# Patient Record
Sex: Male | Born: 1965
Health system: Southern US, Community
[De-identification: ages and names within clinical notes are randomized; demographics above are authoritative.]

---

## 2001-12-14 ENCOUNTER — Encounter: Payer: Self-pay | Admitting: Internal Medicine

## 2001-12-14 ENCOUNTER — Inpatient Hospital Stay (HOSPITAL_COMMUNITY): Admission: EM | Admit: 2001-12-14 | Discharge: 2001-12-17 | Payer: Self-pay | Admitting: Emergency Medicine

## 2001-12-14 ENCOUNTER — Encounter: Payer: Self-pay | Admitting: Emergency Medicine

## 2005-11-18 ENCOUNTER — Encounter: Admission: RE | Admit: 2005-11-18 | Discharge: 2005-11-18 | Payer: Self-pay | Admitting: Emergency Medicine

## 2016-03-09 DIAGNOSIS — M109 Gout, unspecified: Secondary | ICD-10-CM | POA: Diagnosis not present

## 2016-03-09 DIAGNOSIS — E039 Hypothyroidism, unspecified: Secondary | ICD-10-CM | POA: Diagnosis not present

## 2016-03-09 DIAGNOSIS — Z23 Encounter for immunization: Secondary | ICD-10-CM | POA: Diagnosis not present

## 2016-03-09 DIAGNOSIS — I10 Essential (primary) hypertension: Secondary | ICD-10-CM | POA: Diagnosis not present

## 2016-06-22 DIAGNOSIS — Z0000 Encounter for general adult medical examination without abnormal findings: Secondary | ICD-10-CM | POA: Diagnosis not present

## 2016-06-22 DIAGNOSIS — I10 Essential (primary) hypertension: Secondary | ICD-10-CM | POA: Diagnosis not present

## 2016-06-22 DIAGNOSIS — E039 Hypothyroidism, unspecified: Secondary | ICD-10-CM | POA: Diagnosis not present

## 2016-06-22 DIAGNOSIS — Z13220 Encounter for screening for lipoid disorders: Secondary | ICD-10-CM | POA: Diagnosis not present

## 2016-06-22 DIAGNOSIS — M109 Gout, unspecified: Secondary | ICD-10-CM | POA: Diagnosis not present

## 2016-10-21 DIAGNOSIS — E039 Hypothyroidism, unspecified: Secondary | ICD-10-CM | POA: Diagnosis not present

## 2016-12-22 DIAGNOSIS — M109 Gout, unspecified: Secondary | ICD-10-CM | POA: Diagnosis not present

## 2016-12-22 DIAGNOSIS — E039 Hypothyroidism, unspecified: Secondary | ICD-10-CM | POA: Diagnosis not present

## 2016-12-22 DIAGNOSIS — Z23 Encounter for immunization: Secondary | ICD-10-CM | POA: Diagnosis not present

## 2016-12-22 DIAGNOSIS — D72829 Elevated white blood cell count, unspecified: Secondary | ICD-10-CM | POA: Diagnosis not present

## 2016-12-22 DIAGNOSIS — I10 Essential (primary) hypertension: Secondary | ICD-10-CM | POA: Diagnosis not present

## 2017-02-15 DIAGNOSIS — L723 Sebaceous cyst: Secondary | ICD-10-CM | POA: Diagnosis not present

## 2017-03-16 DIAGNOSIS — L723 Sebaceous cyst: Secondary | ICD-10-CM | POA: Diagnosis not present

## 2017-06-27 DIAGNOSIS — M109 Gout, unspecified: Secondary | ICD-10-CM | POA: Diagnosis not present

## 2017-06-27 DIAGNOSIS — Z0000 Encounter for general adult medical examination without abnormal findings: Secondary | ICD-10-CM | POA: Diagnosis not present

## 2017-06-27 DIAGNOSIS — Z13220 Encounter for screening for lipoid disorders: Secondary | ICD-10-CM | POA: Diagnosis not present

## 2017-06-27 DIAGNOSIS — I10 Essential (primary) hypertension: Secondary | ICD-10-CM | POA: Diagnosis not present

## 2017-06-27 DIAGNOSIS — Z131 Encounter for screening for diabetes mellitus: Secondary | ICD-10-CM | POA: Diagnosis not present

## 2017-06-27 DIAGNOSIS — E039 Hypothyroidism, unspecified: Secondary | ICD-10-CM | POA: Diagnosis not present

## 2018-01-04 DIAGNOSIS — I10 Essential (primary) hypertension: Secondary | ICD-10-CM | POA: Diagnosis not present

## 2018-01-04 DIAGNOSIS — E039 Hypothyroidism, unspecified: Secondary | ICD-10-CM | POA: Diagnosis not present

## 2018-01-04 DIAGNOSIS — M109 Gout, unspecified: Secondary | ICD-10-CM | POA: Diagnosis not present

## 2018-01-04 DIAGNOSIS — Z23 Encounter for immunization: Secondary | ICD-10-CM | POA: Diagnosis not present

## 2020-09-02 ENCOUNTER — Other Ambulatory Visit: Payer: Self-pay | Admitting: Family Medicine

## 2020-09-02 DIAGNOSIS — R7401 Elevation of levels of liver transaminase levels: Secondary | ICD-10-CM

## 2020-09-25 ENCOUNTER — Ambulatory Visit
Admission: RE | Admit: 2020-09-25 | Discharge: 2020-09-25 | Disposition: A | Discharge status: Home / Self Care | Payer: Commercial Managed Care - PPO | Source: Ambulatory Visit | Attending: Family Medicine | Admitting: Family Medicine

## 2020-09-25 DIAGNOSIS — R7401 Elevation of levels of liver transaminase levels: Secondary | ICD-10-CM

## 2020-09-30 ENCOUNTER — Other Ambulatory Visit: Payer: Self-pay | Admitting: *Deleted

## 2020-09-30 DIAGNOSIS — Z87891 Personal history of nicotine dependence: Secondary | ICD-10-CM

## 2020-11-06 ENCOUNTER — Encounter: Payer: Commercial Managed Care - PPO | Admitting: Primary Care

## 2020-11-06 ENCOUNTER — Ambulatory Visit: Payer: Commercial Managed Care - PPO

## 2020-11-20 ENCOUNTER — Encounter: Payer: Commercial Managed Care - PPO | Admitting: Primary Care

## 2020-11-20 ENCOUNTER — Telehealth: Payer: Self-pay | Admitting: Primary Care

## 2020-11-20 ENCOUNTER — Other Ambulatory Visit: Payer: Self-pay

## 2020-11-20 NOTE — Progress Notes (Deleted)
Shared Decision Making Visit Lung Cancer Screening Program [phone])   Eligibility: Age 55 y.o. Pack Years Smoking History Calculation *** (# packs/per year x # years smoked) Recent History of coughing up blood  {YES NO:22349} Unexplained weight loss? {YES NO:22349} ( >Than 15 pounds within the last 6 months ) Prior History Lung / other cancer {YES NO:22349} (Diagnosis within the last 5 years already requiring surveillance chest CT Scans). Smoking Status {Smoking Status:21012044} Former Smokers: Years since quit: {Smoking numbers:21012046}  Quit Date: ***  Visit Components: Discussion included one or more decision making aids. {YES NO:22349} Discussion included risk/benefits of screening. {YES NO:22349} Discussion included potential follow up diagnostic testing for abnormal scans. {YES NO:22349} Discussion included meaning and risk of over diagnosis. {YES NO:22349} Discussion included meaning and risk of False Positives. {YES NO:22349} Discussion included meaning of total radiation exposure. {YES [id number]  Counseling Included: Importance of adherence to annual lung cancer LDCT screening. {YES NO:22349} Impact of comorbidities on ability to participate in the program. {YES NO:22349} Ability and willingness to under diagnostic treatment. {YES NO:22349}  Smoking Cessation Counseling: Current Smokers:  Discussed importance of smoking cessation. {YES [id number] Information about tobacco cessation classes and interventions provided to patient. {YES [id number] Patient provided with "ticket" for LDCT Scan. {YES NO:22349} Symptomatic Patient. {YES NO:22349}  Counseling{Symptomatic Patient:21012041} Diagnosis Code: Tobacco Use Z72.0 Asymptomatic Patient {YES NO:22349}  Counseling {Asymptomatic patient:21012042} Former Smokers:  Discussed the importance of maintaining cigarette abstinence. {YES NO:22349} Diagnosis Code: Personal History of Nicotine Dependence. R97.588 Information about  tobacco cessation classes and interventions provided to patient. {Responses; yes/no/refused:32142} Patient provided with "ticket" for LDCT Scan. {YES [id number] Written Order for Lung Cancer Screening with LDCT placed in Epic. {Smoking cessesion custom:21012043} (CT Chest Lung Cancer Screening Low Dose W/O CM) [id number] Z12.2-Screening of respiratory organs Z87.891-Personal history of nicotine dependence   Glenford Bayley, NP

## 2020-11-20 NOTE — Telephone Encounter (Signed)
Called patient twice with no answer. I left message that patient needs to reschedule shared decision making visit before his LDCT scan scheduled for 9/23 at 4:30pm

## 2020-11-23 NOTE — Telephone Encounter (Signed)
Spoke with pt and rescheduled shared decision visit for 12/16/20 11:30. CT will be rescheduled. PT verbalized understanding. Nothing further needed.

## 2020-11-27 ENCOUNTER — Ambulatory Visit: Payer: Commercial Managed Care - PPO

## 2020-12-16 ENCOUNTER — Encounter: Payer: Commercial Managed Care - PPO | Admitting: Acute Care

## 2020-12-18 ENCOUNTER — Ambulatory Visit: Payer: Commercial Managed Care - PPO

## 2021-04-16 ENCOUNTER — Encounter: Payer: Commercial Managed Care - PPO | Admitting: Primary Care

## 2021-04-16 ENCOUNTER — Ambulatory Visit
Admission: RE | Admit: 2021-04-16 | Discharge: 2021-04-16 | Disposition: A | Discharge status: Home / Self Care | Payer: Commercial Managed Care - PPO | Source: Ambulatory Visit | Attending: Acute Care | Admitting: Acute Care

## 2021-04-16 ENCOUNTER — Ambulatory Visit: Payer: Commercial Managed Care - PPO

## 2021-04-16 ENCOUNTER — Encounter: Payer: Self-pay | Admitting: Acute Care

## 2021-04-16 ENCOUNTER — Other Ambulatory Visit: Payer: Self-pay

## 2021-04-16 ENCOUNTER — Ambulatory Visit (INDEPENDENT_AMBULATORY_CARE_PROVIDER_SITE_OTHER): Payer: Commercial Managed Care - PPO | Admitting: Acute Care

## 2021-04-16 DIAGNOSIS — Z87891 Personal history of nicotine dependence: Secondary | ICD-10-CM | POA: Diagnosis not present

## 2021-04-16 NOTE — Patient Instructions (Signed)
Thank you for participating in the [place] [facility] Screening Program. °It was our pleasure to meet you today. °We will call you with the results of your scan within the next few days. °Your scan will be assigned a Lung RADS category score by the physicians reading the scans.  °This Lung RADS score determines follow up scanning.  °See below for description of categories, and follow up screening recommendations. °We will be in touch to schedule your follow up screening annually or based on recommendations of our providers. °We will fax a copy of your scan results to your Primary Care Physician, or the physician who referred you to the program, to ensure they have the results. °Please call the office if you have any questions or concerns regarding your scanning experience or results.  °Our office number is [phone]. °Please speak with Denise Phelps, RN. She is our Lung Cancer Screening RN. °If she is unavailable when you call, please have the office staff send her a message. She will return your call at her earliest convenience. °Remember, if your scan is normal, we will scan you annually as long as you continue to meet the criteria for the program. (Age 55-77, Current smoker or smoker who has quit within the last 15 years). °If you are a smoker, remember, quitting is the single most powerful action that you can take to decrease your risk of lung cancer and other pulmonary, breathing related problems. °We know quitting is hard, and we are here to help.  °Please let us know if there is anything we can do to help you meet your goal of quitting. °If you are a former smoker, congratulations. We are proud of you! Remain smoke free! °Remember you can refer friends or family members through the number above.  °We will screen them to make sure they meet criteria for the program. °Thank you for helping us take better care of you by participating in Lung Screening. ° °You can receive free nicotine replacement therapy  ( patches, gum or mints) by calling 1-800-QUIT NOW. Please call so we can get you on the path to becoming  a non-smoker. I know it is hard, but you can do this! ° °Lung RADS Categories: ° °Lung RADS 1: no nodules or definitely non-concerning nodules.  °Recommendation is for a repeat annual scan in 12 months. ° °Lung RADS 2:  nodules that are non-concerning in appearance and behavior with a very low likelihood of becoming an active cancer. °Recommendation is for a repeat annual scan in 12 months. ° °Lung RADS 3: nodules that are probably non-concerning , includes nodules with a low likelihood of becoming an active cancer.  Recommendation is for a 6-month repeat screening scan. Often noted after an upper respiratory illness. We will be in touch to make sure you have no questions, and to schedule your 6-month scan. ° °Lung RADS 4 A: nodules with concerning findings, recommendation is most often for a follow up scan in 3 months or additional testing based on our provider's assessment of the scan. We will be in touch to make sure you have no questions and to schedule the recommended 3 month follow up scan. ° °Lung RADS 4 B:  indicates findings that are concerning. We will be in touch with you to schedule additional diagnostic testing based on our provider's  assessment of the scan. ° °Hypnosis for smoking cessation  °[employer] °[phone] ° °Acupuncture for smoking cessation  °[facility] °[phone]  °

## 2021-04-16 NOTE — Progress Notes (Signed)
Virtual Visit via Telephone Note  I connected with Randy Watkins on 04/16/21 at  9:30 AM EST by telephone and verified that I am speaking with the correct person using two identifiers.  Location: Patient:  At home Provider:  [address], [place], [place], [address]    I discussed the limitations, risks, security and privacy concerns of performing an evaluation and management service by telephone and the availability of in person appointments. I also discussed with the patient that there may be a patient responsible charge related to this service. The patient expressed understanding and agreed to proceed.   Shared Decision Making Visit Lung Cancer Screening Program [phone])   Eligibility: Age 56 y.o. Pack Years Smoking History Calculation 37 pack year smoking history (# packs/per year x # years smoked) Recent History of coughing up blood  no Unexplained weight loss? no ( >Than 15 pounds within the last 6 months ) Prior History Lung / other cancer no (Diagnosis within the last 5 years already requiring surveillance chest CT Scans). Smoking Status Former Smoker Former Smokers: Years since quit: < 1 year  Quit Date: 01/30/2021  Visit Components: Discussion included one or more decision making aids. yes Discussion included risk/benefits of screening. yes Discussion included potential follow up diagnostic testing for abnormal scans. yes Discussion included meaning and risk of over diagnosis. yes Discussion included meaning and risk of False Positives. yes Discussion included meaning of total radiation exposure. yes  Counseling Included: Importance of adherence to annual lung cancer LDCT screening. yes Impact of comorbidities on ability to participate in the program. yes Ability and willingness to under diagnostic treatment. yes  Smoking Cessation Counseling: Current Smokers:  Discussed importance of smoking cessation. yes Information about tobacco cessation classes and  interventions provided to patient. yes Patient provided with "ticket" for LDCT Scan. yes Symptomatic Patient. no  Counseling NA Diagnosis Code: Tobacco Use Z72.0 Asymptomatic Patient yes  Counseling (Intermediate counseling: > three minutes counseling) [id number] Former Smokers:  Discussed the importance of maintaining cigarette abstinence. yes Diagnosis Code: Personal History of Nicotine Dependence. Y48.250 Information about tobacco cessation classes and interventions provided to patient. Yes Patient provided with "ticket" for LDCT Scan. yes Written Order for Lung Cancer Screening with LDCT placed in Epic. Yes (CT Chest Lung Cancer Screening Low Dose W/O CM) [id number] Z12.2-Screening of respiratory organs Z87.891-Personal history of nicotine dependence  I spent 25 minutes of face to face time/virtual visit time  with  Randy Watkins discussing the risks and benefits of lung cancer screening. We took the time to pause the power point at intervals to allow for questions to be asked and answered to ensure understanding. We discussed that he had taken the single most powerful action possible to decrease his risk of developing lung cancer when he quit smoking. I counseled him to remain smoke free, and to contact me if he ever had the desire to smoke again so that I can provide resources and tools to help support the effort to remain smoke free. We discussed the time and location of the scan, and that either  Abigail Miyamoto RN, Karlton Lemon, RN or I  or I will call / send a letter with the results within  24-72 hours of receiving them. He has the office contact information in the event he needs to speak with me,  he verbalized understanding of all of the above and had no further questions upon leaving the office.     I explained to the patient that there  has been a high incidence of coronary artery disease noted on these exams. I explained that this is a non-gated exam therefore degree or severity cannot be  determined. This patient is on statin therapy. I have asked the patient to follow-up with their PCP regarding any incidental finding of coronary artery disease and management with diet or medication as they feel is clinically indicated. The patient verbalized understanding of the above and had no further questions.  Quit 01/2021 and has not had a cigarette since     Bevelyn Ngo, NP 04/16/2021

## 2021-04-26 ENCOUNTER — Telehealth: Payer: Self-pay | Admitting: Acute Care

## 2021-04-26 DIAGNOSIS — Z87891 Personal history of nicotine dependence: Secondary | ICD-10-CM

## 2021-04-26 DIAGNOSIS — R911 Solitary pulmonary nodule: Secondary | ICD-10-CM

## 2021-04-26 NOTE — Addendum Note (Signed)
Addended by: Karlton Lemon on: 04/26/2021 04:13 PM   Modules accepted: Orders

## 2021-04-26 NOTE — Telephone Encounter (Signed)
Order placed for follow up scan 6 months.  Results and plan faxed to PCP

## 2021-04-26 NOTE — Telephone Encounter (Signed)
I have called the patient with the results of his low dose CT Chest. His scan was read as a Lung  RADS 3, nodules that are probably benign findings, short term follow up suggested: includes nodules with a low likelihood of becoming a clinically active cancer. Radiology recommends a 6 month repeat LDCT follow up. Pt. Is in agreement with this plan. 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 Calcifications of the aortic valve.Pt. is on statin therapy.  Angelique Blonder, please fax results to PCP and order 6 month follow up low dose CT Chest. Thanks so much

## 2021-11-12 ENCOUNTER — Ambulatory Visit
Admission: RE | Admit: 2021-11-12 | Discharge: 2021-11-12 | Disposition: A | Discharge status: Home / Self Care | Payer: Commercial Managed Care - PPO | Source: Ambulatory Visit | Attending: Acute Care | Admitting: Acute Care

## 2021-11-12 DIAGNOSIS — R911 Solitary pulmonary nodule: Secondary | ICD-10-CM

## 2021-11-12 DIAGNOSIS — Z87891 Personal history of nicotine dependence: Secondary | ICD-10-CM

## 2021-11-16 ENCOUNTER — Other Ambulatory Visit: Payer: Self-pay

## 2021-11-16 ENCOUNTER — Telehealth: Payer: Self-pay

## 2021-11-16 DIAGNOSIS — Z87891 Personal history of nicotine dependence: Secondary | ICD-10-CM

## 2021-11-16 DIAGNOSIS — Z122 Encounter for screening for malignant neoplasm of respiratory organs: Secondary | ICD-10-CM

## 2021-11-16 NOTE — Telephone Encounter (Signed)
Spoke with patient by phone using two patient identifiers.  Results of LDCT follow up scan reviewed with patient.  No changes to lung nodule needing follow up so no concerns for cancer at this time. Will place back on annual LDCT schedule.  Hepatic nodule also unchanged and not concerning at this time.  Patient acknowledged understanding and had no further questions.  Results to PCP and new order placed for LDCT 2024

## 2022-02-08 IMAGING — US US ABDOMEN LIMITED
1 series · 14 of 25 positions shown · non-contrast
Comparison: None.

CLINICAL DATA: Transaminitis

EXAM:
ULTRASOUND ABDOMEN LIMITED RIGHT UPPER QUADRANT

[Series 1: us abdomen limited · 0.20mm/px · 14 of 79 slices shown]
[im 1/79]
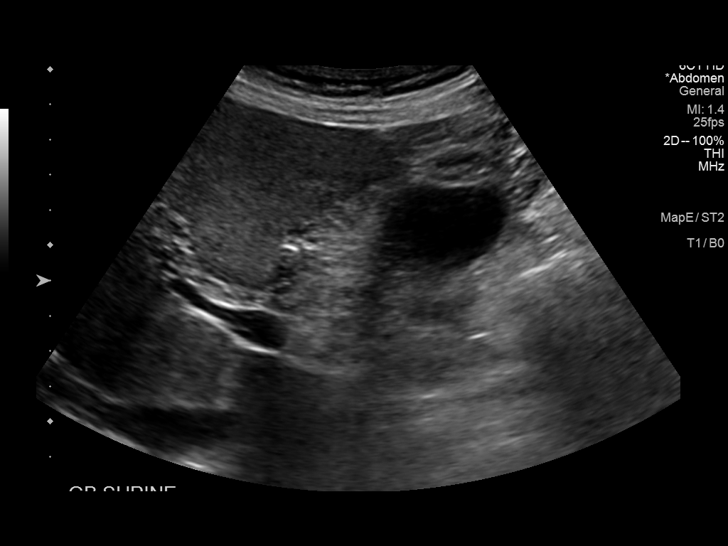
[im 7/79]
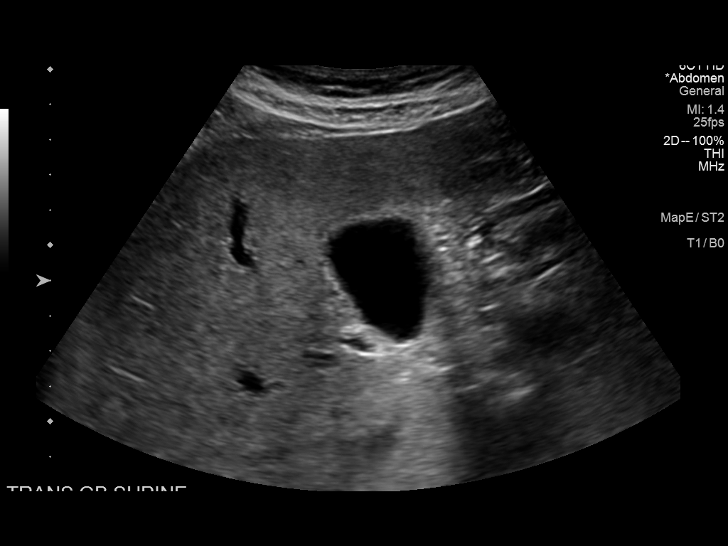
[im 14/79]
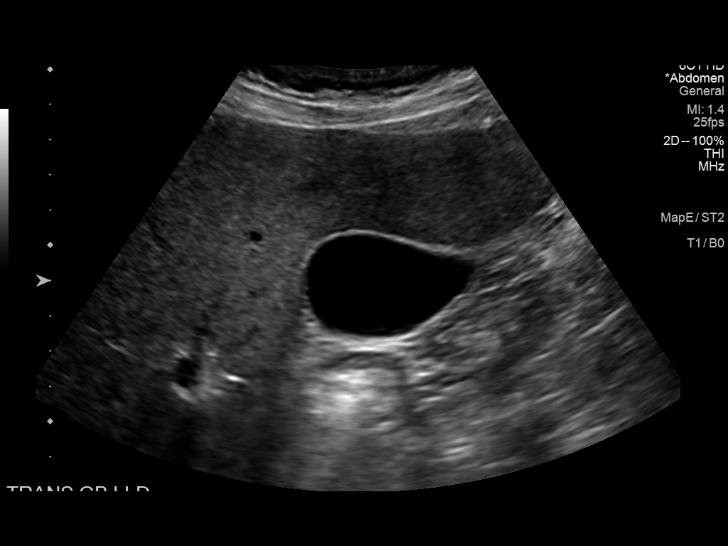
[im 20/79]
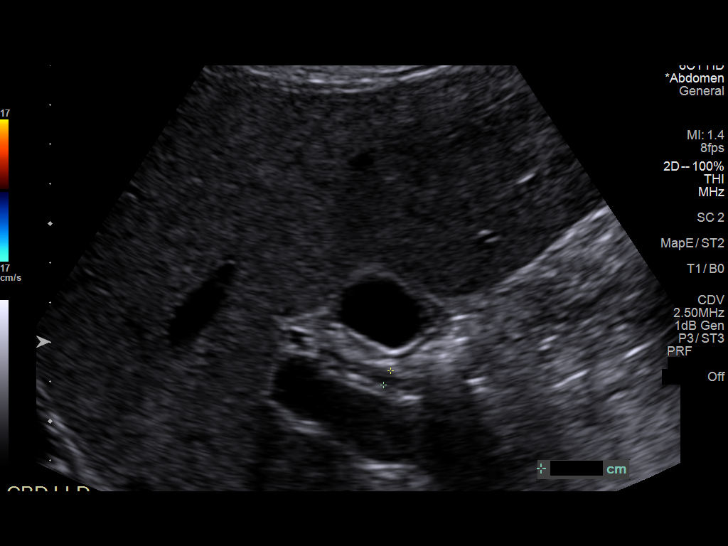
[im 27/79]
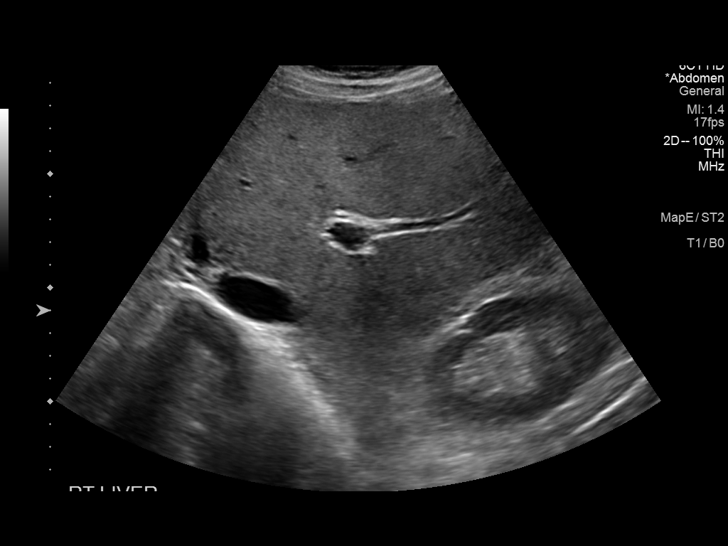
[im 30/79]
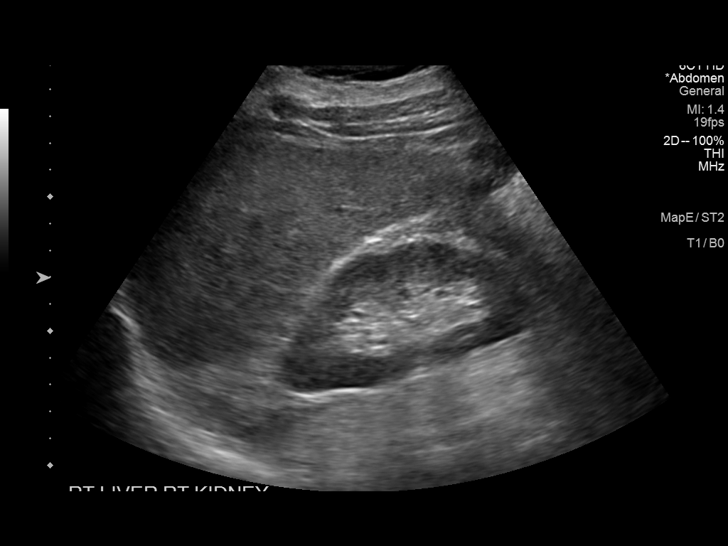
[im 36/79]
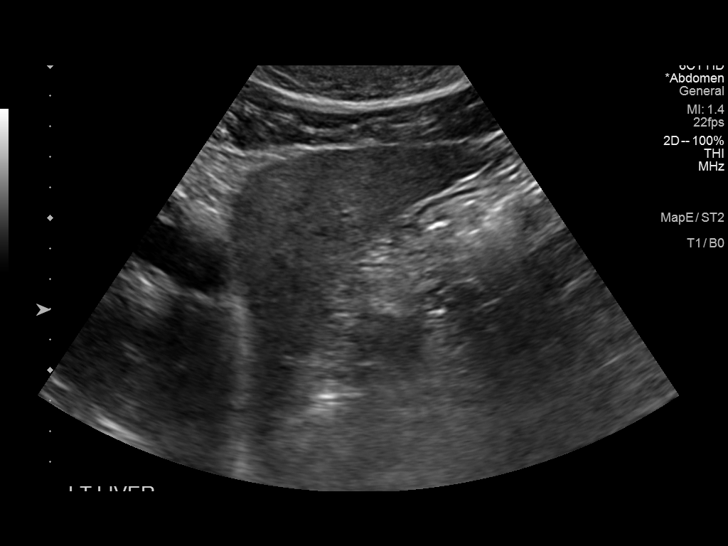
[im 43/79]
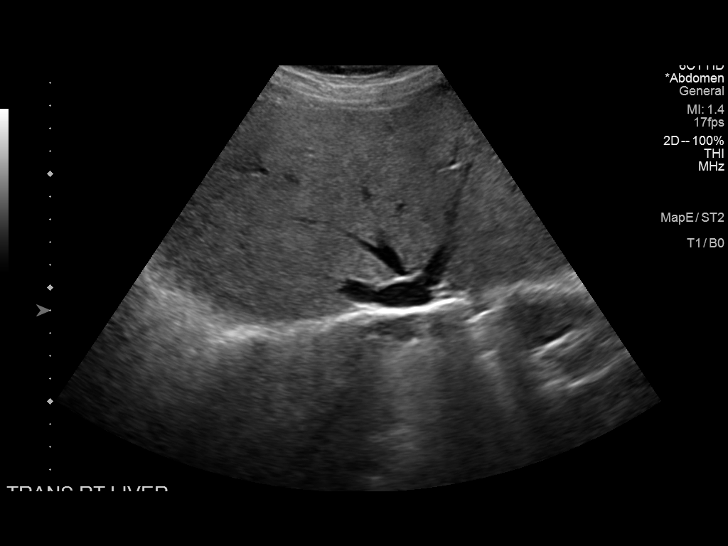
[im 49/79]
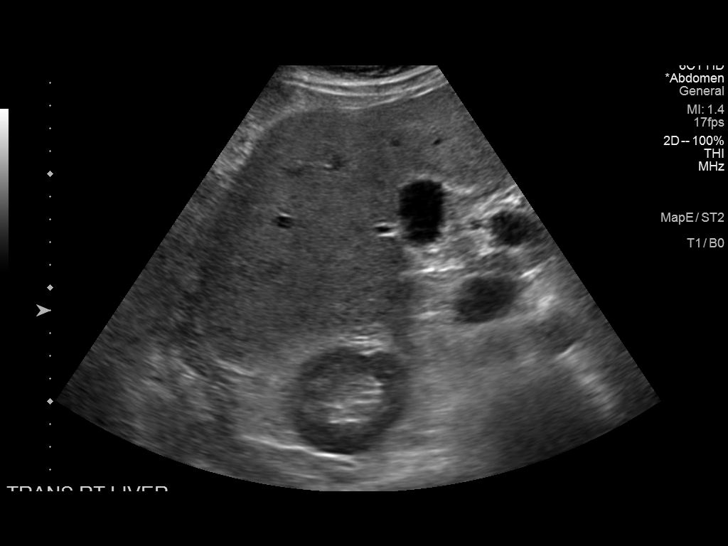
[im 53/79]
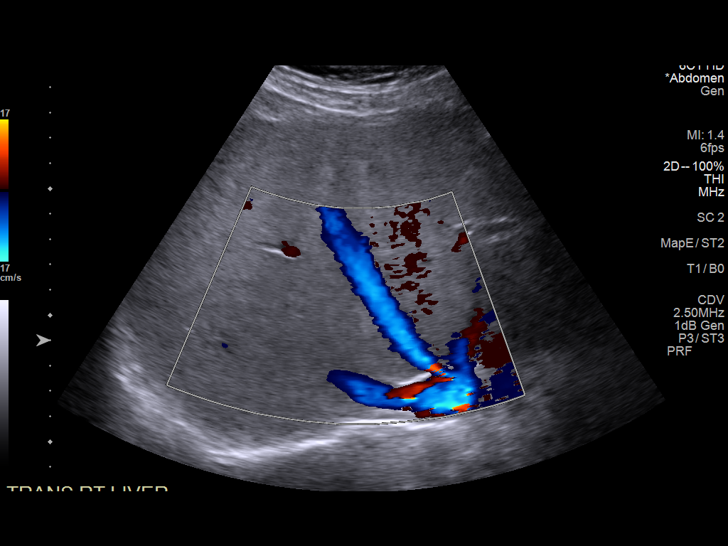
[im 59/79]
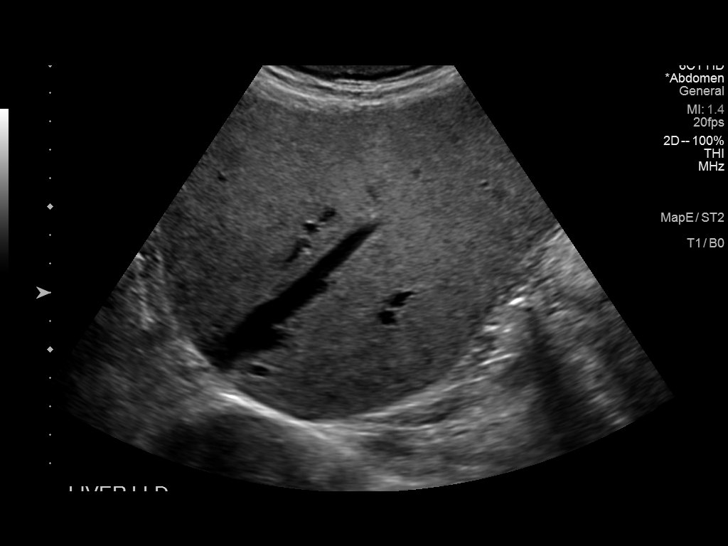
[im 66/79]
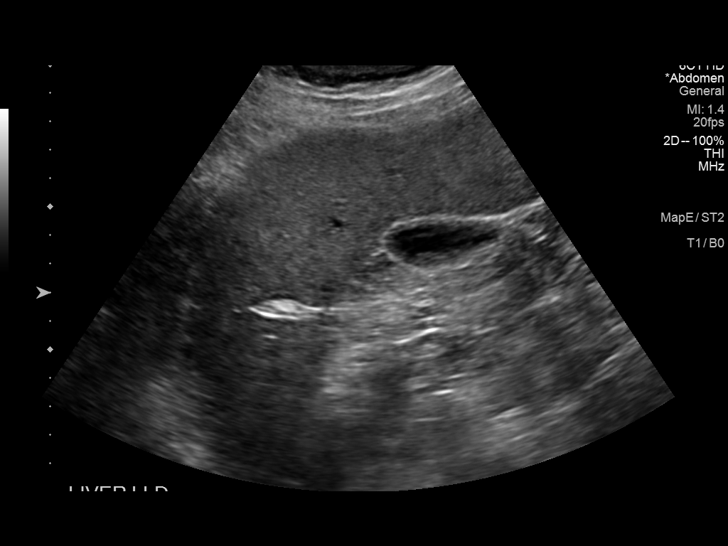
[im 72/79]
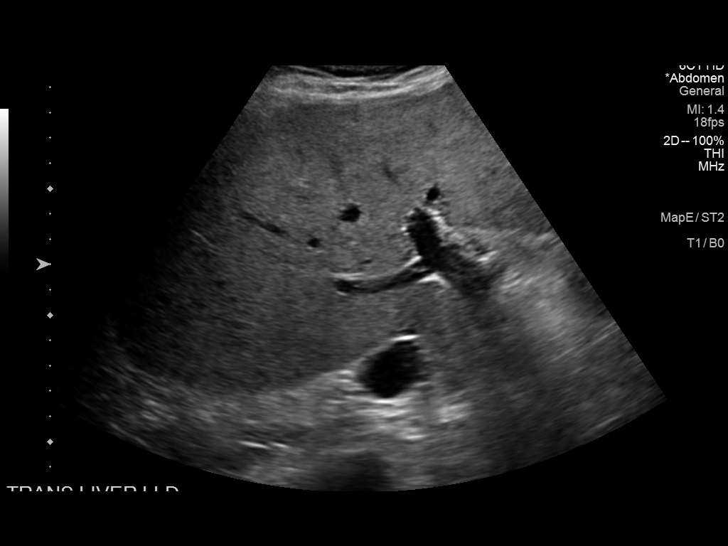
[im 79/79]
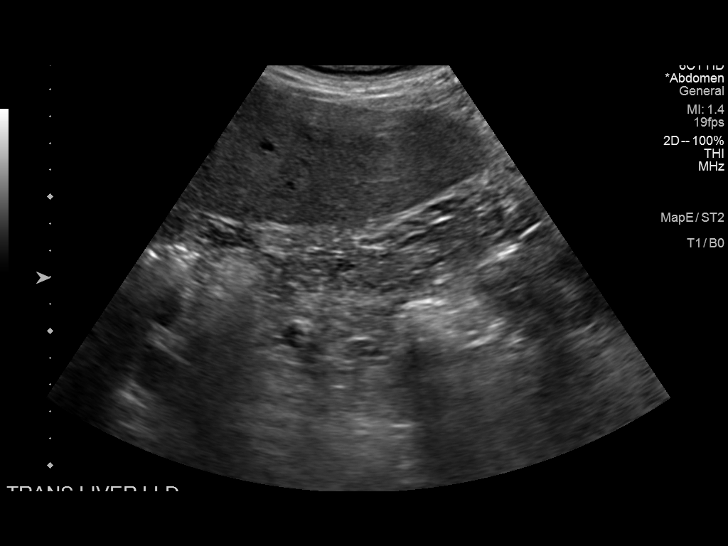

[14 of 25 positions shown; findings below may reference images not displayed]

FINDINGS: Gallbladder:

No gallstones or wall thickening visualized. No sonographic Murphy
sign noted by sonographer.

Common bile duct:

Diameter: 4 mm

Liver:

Mildly increased liver echogenicity. Portal vein is patent on color
Doppler imaging with normal direction of blood flow towards the
liver.

Other: None.
IMPRESSION: Mildly increased liver echogenicity which can be seen with low-grade
hepatic steatosis.

Unremarkable gallbladder.  No evidence of biliary obstruction.

## 2022-11-15 ENCOUNTER — Inpatient Hospital Stay: Admission: RE | Admit: 2022-11-15 | Payer: Commercial Managed Care - PPO | Source: Ambulatory Visit

## 2023-10-10 NOTE — Progress Notes (Signed)
 This encounter was created in error - please disregard.
